# Patient Record
Sex: Female | Born: 2000 | Hispanic: No | Marital: Single | Smoking: Current every day smoker
Health system: Southern US, Community
[De-identification: ages and names within clinical notes are randomized; demographics above are authoritative.]

## PROBLEM LIST (undated history)

## (undated) DIAGNOSIS — K589 Irritable bowel syndrome without diarrhea: Secondary | ICD-10-CM

## (undated) DIAGNOSIS — R519 Headache, unspecified: Secondary | ICD-10-CM

## (undated) DIAGNOSIS — G8929 Other chronic pain: Secondary | ICD-10-CM

## (undated) HISTORY — DX: Headache, unspecified: R51.9

## (undated) HISTORY — DX: Other chronic pain: G89.29

---
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
## 2019-03-27 ENCOUNTER — Encounter: Payer: Self-pay | Admitting: Advanced Practice Midwife

## 2019-03-27 ENCOUNTER — Ambulatory Visit (LOCAL_COMMUNITY_HEALTH_CENTER): Payer: Medicaid Other | Admitting: Advanced Practice Midwife

## 2019-03-27 ENCOUNTER — Other Ambulatory Visit: Payer: Self-pay

## 2019-03-27 VITALS — BP 107/67 | Ht 59.0 in | Wt 97.0 lb

## 2019-03-27 DIAGNOSIS — Z3009 Encounter for other general counseling and advice on contraception: Secondary | ICD-10-CM

## 2019-03-27 DIAGNOSIS — Z30017 Encounter for initial prescription of implantable subdermal contraceptive: Secondary | ICD-10-CM

## 2019-03-27 MED ORDER — MULTIVITAMINS PO CAPS: 1.0000 | ORAL_CAPSULE | Freq: Every day | ORAL | 0 refills | Status: AC

## 2019-03-27 MED ORDER — ETONOGESTREL 68 MG ~~LOC~~ IMPL
68.0000 mg | DRUG_IMPLANT | Freq: Once | SUBCUTANEOUS | Status: AC
  Administered 2019-03-27: 16:00:00 68 mg via SUBCUTANEOUS

## 2019-03-27 NOTE — Progress Notes (Signed)
   [facility] problem visit  [facility]  Subjective:  Alicia Lawrence is a 19 y.o. SAF nullip nonsmoker being seen today for Nexplanon insertion  Chief Complaint  Patient presents with  . Procedure  . Contraception    HPI  LMP 03/08/19.  Last sex 03/24/19 with condom.  In 11 mo relationship with partner who is treating her much better than last "toxic" relationship whom she put a restraining order on.  Last physical 04/06/17.  Last DMPA 12/25/18. Does the patient have a current or past history of drug use? No   No components found for: HCV]   Health Maintenance Due  Topic Date Due  . HIV Screening  09/06/2014  . TETANUS/TDAP  09/06/2018  . INFLUENZA VACCINE  09/08/2018    ROS  The following portions of the patient's history were reviewed and updated as appropriate: allergies, current medications, past family history, past medical history, past social history, past surgical history and problem list. Problem list updated.   See flowsheet for other program required questions.  Objective:   Vitals:   03/27/19 1527  BP: 107/67  Weight: 97 lb (44 kg)  Height: 4\' 11"  (1.499 m)    Physical Exam n/a   Assessment and Plan:  Alicia Lawrence is a 19 y.o. female presenting to the [facility] Department for a Women's Health problem visit  1. Nexplanon insertion Nexplanon Insertion Procedure Patient identified, informed consent performed, consent signed.   Patient does understand that irregular bleeding is a very common side effect of this medication. She was advised to have backup contraception after placement. Patient was determined to meet WHO criteria for not being pregnant. Appropriate time out taken.  The insertion site was identified 8-10 cm (3-4 inches) from the medial epicondyle of the humerus and 3-5 cm (1.25-2 inches) posterior to (below) the sulcus (groove) between the biceps and triceps muscles of the patient's left arm and marked.   Patient was prepped with alcohol swab and then injected with 3 ml of 1% lidocaine.  Arm was prepped with chlorhexidene, Nexplanon removed from packaging,  Device confirmed in needle, then inserted full length of needle and withdrawn per handbook instructions. Nexplanon was able to palpated in the patient's arm; patient palpated the insert herself. There was minimal blood loss.  Patient insertion site covered with guaze and a pressure bandage to reduce any bruising.  The patient tolerated the procedure well and was given post procedure instructions.  Nexplanon:   Counseled patient to take OTC analgesic starting as soon as lidocaine starts to wear off and take regularly for at least 48 hr to decrease discomfort.  Specifically to take with food or milk to decrease stomach upset and for IB 600 mg (3 tablets) every 6 hrs; IB 800 mg (4 tablets) every 8 hrs; or Aleve 2 tablets every 12 hrs.    - etonogestrel (NEXPLANON) implant 68 mg  2. Family planning Still covered with DMPA so no need for backup birth control  3. Encounter for initial prescription of implantable subdermal contraceptive      No follow-ups on file.  No future appointments.  [facility], CNM

## 2019-03-27 NOTE — Progress Notes (Signed)
Here today for Nexplanon Insertion. Last PE was here 04/06/2017. Last Depo was 12/25/2018 (13.1 weeks.) Tawny Hopping, RN

## 2019-03-27 NOTE — Progress Notes (Signed)
MVI's and condoms given. Tawny Hopping, RN

## 2019-03-27 NOTE — Addendum Note (Signed)
Addended by: Tawny Hopping A on: 03/27/2019 04:31 PM   Modules accepted: Orders

## 2020-03-19 ENCOUNTER — Encounter (HOSPITAL_COMMUNITY): Payer: Self-pay

## 2020-03-19 ENCOUNTER — Emergency Department (HOSPITAL_COMMUNITY): Payer: Medicaid Other

## 2020-03-19 ENCOUNTER — Other Ambulatory Visit: Payer: Self-pay

## 2020-03-19 ENCOUNTER — Ambulatory Visit (HOSPITAL_COMMUNITY)
Admission: EM | Admit: 2020-03-19 | Discharge: 2020-03-19 | Disposition: A | Discharge status: Home / Self Care | Payer: Medicaid Other | Attending: Emergency Medicine | Admitting: Emergency Medicine

## 2020-03-19 ENCOUNTER — Emergency Department (HOSPITAL_COMMUNITY)
Admission: EM | Admit: 2020-03-19 | Discharge: 2020-03-19 | Disposition: A | Discharge status: Home / Self Care | Payer: Medicaid Other | Attending: Emergency Medicine | Admitting: Emergency Medicine

## 2020-03-19 DIAGNOSIS — Z1152 Encounter for screening for COVID-19: Secondary | ICD-10-CM

## 2020-03-19 DIAGNOSIS — N12 Tubulo-interstitial nephritis, not specified as acute or chronic: Secondary | ICD-10-CM

## 2020-03-19 DIAGNOSIS — R7989 Other specified abnormal findings of blood chemistry: Secondary | ICD-10-CM

## 2020-03-19 DIAGNOSIS — N10 Acute tubulo-interstitial nephritis: Secondary | ICD-10-CM | POA: Insufficient documentation

## 2020-03-19 DIAGNOSIS — R109 Unspecified abdominal pain: Secondary | ICD-10-CM

## 2020-03-19 DIAGNOSIS — R945 Abnormal results of liver function studies: Secondary | ICD-10-CM | POA: Insufficient documentation

## 2020-03-19 HISTORY — DX: Irritable bowel syndrome, unspecified: K58.9

## 2020-03-19 LAB — POCT URINALYSIS DIPSTICK, ED / UC
Bilirubin Urine: NEGATIVE
Glucose, UA: NEGATIVE mg/dL
Ketones, ur: NEGATIVE mg/dL
Nitrite: NEGATIVE
Protein, ur: 100 mg/dL — AB
Specific Gravity, Urine: 1.015 (ref 1.005–1.030)
Urobilinogen, UA: 1 mg/dL (ref 0.0–1.0)
pH: 7 (ref 5.0–8.0)

## 2020-03-19 LAB — CBC WITH DIFFERENTIAL/PLATELET
Abs Immature Granulocytes: 0.03 K/uL (ref 0.00–0.07)
Basophils Absolute: 0 K/uL (ref 0.0–0.1)
Basophils Relative: 0 %
Eosinophils Absolute: 0 K/uL (ref 0.0–0.5)
Eosinophils Relative: 0 %
HCT: 28 % — ABNORMAL LOW (ref 36.0–46.0)
Hemoglobin: 9.5 g/dL — ABNORMAL LOW (ref 12.0–15.0)
Immature Granulocytes: 0 %
Lymphocytes Relative: 5 %
Lymphs Abs: 0.5 K/uL — ABNORMAL LOW (ref 0.7–4.0)
MCH: 23.4 pg — ABNORMAL LOW (ref 26.0–34.0)
MCHC: 33.9 g/dL (ref 30.0–36.0)
MCV: 69 fL — ABNORMAL LOW (ref 80.0–100.0)
Monocytes Absolute: 0.8 K/uL (ref 0.1–1.0)
Monocytes Relative: 8 %
Neutro Abs: 8.9 K/uL — ABNORMAL HIGH (ref 1.7–7.7)
Neutrophils Relative %: 87 %
Platelets: 183 K/uL (ref 150–400)
RBC: 4.06 MIL/uL (ref 3.87–5.11)
RDW: 13.7 % (ref 11.5–15.5)
WBC: 10.2 K/uL (ref 4.0–10.5)
nRBC: 0 % (ref 0.0–0.2)

## 2020-03-19 LAB — COMPREHENSIVE METABOLIC PANEL
ALT: 96 U/L — ABNORMAL HIGH (ref 0–44)
AST: 127 U/L — ABNORMAL HIGH (ref 15–41)
Albumin: 4.2 g/dL (ref 3.5–5.0)
Alkaline Phosphatase: 55 U/L (ref 38–126)
Anion gap: 11 (ref 5–15)
BUN: 11 mg/dL (ref 6–20)
CO2: 22 mmol/L (ref 22–32)
Calcium: 8.9 mg/dL (ref 8.9–10.3)
Chloride: 102 mmol/L (ref 98–111)
Creatinine, Ser: 0.76 mg/dL (ref 0.44–1.00)
GFR, Estimated: >60 mL/min (ref >60)
Glucose, Bld: 100 mg/dL — ABNORMAL HIGH (ref 70–99)
Potassium: 3.6 mmol/L (ref 3.5–5.1)
Sodium: 135 mmol/L (ref 135–145)
Total Bilirubin: 1 mg/dL (ref 0.3–1.2)
Total Protein: 8.2 g/dL — ABNORMAL HIGH (ref 6.5–8.1)

## 2020-03-19 LAB — URINALYSIS, ROUTINE W REFLEX MICROSCOPIC
Bilirubin Urine: NEGATIVE
Glucose, UA: NEGATIVE mg/dL
Ketones, ur: 5 mg/dL — AB
Nitrite: POSITIVE — AB
Protein, ur: NEGATIVE mg/dL
Specific Gravity, Urine: 1.003 — ABNORMAL LOW (ref 1.005–1.030)
WBC, UA: >50 WBC/hpf — ABNORMAL HIGH (ref 0–5)
pH: 6 (ref 5.0–8.0)

## 2020-03-19 LAB — POC URINE PREG, ED: Preg Test, Ur: NEGATIVE

## 2020-03-19 LAB — LACTIC ACID, PLASMA: Lactic Acid, Venous: 1.1 mmol/L (ref 0.5–1.9)

## 2020-03-19 LAB — LIPASE, BLOOD: Lipase: 29 U/L (ref 11–51)

## 2020-03-19 MED ORDER — ONDANSETRON 4 MG PO TBDP
ORAL_TABLET | ORAL | Status: AC
  Filled 2020-03-19: qty 1

## 2020-03-19 MED ORDER — ONDANSETRON HCL 4 MG PO TABS: 4.0000 mg | ORAL_TABLET | Freq: Four times a day (QID) | ORAL | 0 refills | Status: DC

## 2020-03-19 MED ORDER — SODIUM CHLORIDE 0.9 % IV BOLUS
1000.0000 mL | Freq: Once | INTRAVENOUS | Status: AC
  Administered 2020-03-19: 1000 mL via INTRAVENOUS

## 2020-03-19 MED ORDER — IOHEXOL 300 MG/ML  SOLN
100.0000 mL | Freq: Once | INTRAMUSCULAR | Status: AC | PRN
  Administered 2020-03-19: 100 mL via INTRAVENOUS

## 2020-03-19 MED ORDER — ONDANSETRON HCL 4 MG/2ML IJ SOLN
4.0000 mg | Freq: Once | INTRAMUSCULAR | Status: AC
  Administered 2020-03-19: 4 mg via INTRAVENOUS
  Filled 2020-03-19: qty 2

## 2020-03-19 MED ORDER — KETOROLAC TROMETHAMINE 30 MG/ML IJ SOLN
30.0000 mg | Freq: Once | INTRAMUSCULAR | Status: AC
  Administered 2020-03-19: 30 mg via INTRAMUSCULAR

## 2020-03-19 MED ORDER — SODIUM CHLORIDE 0.9 % IV SOLN
1.0000 g | Freq: Once | INTRAVENOUS | Status: AC
  Administered 2020-03-19: 1 g via INTRAVENOUS
  Filled 2020-03-19: qty 10

## 2020-03-19 MED ORDER — ONDANSETRON 4 MG PO TBDP
4.0000 mg | ORAL_TABLET | Freq: Once | ORAL | Status: AC
  Administered 2020-03-19: 4 mg via ORAL

## 2020-03-19 MED ORDER — KETOROLAC TROMETHAMINE 30 MG/ML IJ SOLN
INTRAMUSCULAR | Status: AC
  Filled 2020-03-19: qty 1

## 2020-03-19 MED ORDER — CEPHALEXIN 500 MG PO CAPS: 500.0000 mg | ORAL_CAPSULE | Freq: Four times a day (QID) | ORAL | 0 refills | Status: AC

## 2020-03-19 NOTE — Discharge Instructions (Addendum)
  You were diagnosed with a kidney infection called pyelonephritis. For this, you were given a prescription for antibiotics. Please take the antibiotic prescription fully. You were also given a prescription for nausea medications.   Additionally, you were noted to have elevated liver enzymes. You will need to follow up with your primary physician to have your liver enzymes rechecked. You should abstain from alcohol.   Please follow up with your primary doctor within the next 5-7 days.  If you do not have a primary care provider, information for a healthcare clinic has been provided for you to make arrangements for follow up care. Please return to the ER sooner if you have any new or worsening symptoms, or if you have any of the following symptoms:  Abdominal pain that does not go away.  You have a fever.  You keep throwing up (vomiting).  The pain is felt only in portions of the abdomen. Pain in the right side could possibly be appendicitis. In an adult, pain in the left lower portion of the abdomen could be colitis or diverticulitis.  You pass bloody or black tarry stools.  There is bright red blood in the stool.  The constipation stays for more than 4 days.  There is belly (abdominal) or rectal pain.  You do not seem to be getting better.  You have any questions or concerns.

## 2020-03-19 NOTE — ED Provider Notes (Addendum)
HPI  SUBJECTIVE:  Alicia Lawrence is a 20 y.o. female who presents with 5 days of fevers T-max 102.8 measured here, constipation, headache, body aches, left back and flank pain described as constant tightness.  She states that this seems to be getting a little better today.  She states that she has felt nauseous and has been making herself throw up when she feels the need to belch, but started vomiting greenish nonbloody emesis spontaneously today.  She states that she has vomited "a lot" today, and Is unable to keep anything down.  No nasal congestion, rhinorrhea, sore throat, loss of smell or taste, cough, shortness of breath.  No dysuria, urgency, frequency, cloudy, odorous urine, hematuria, change in urine output.  No pelvic pain, vaginal bleeding, odor, discharge.  No abdominal distention.  No antipyretic in the past 6 hours.  She has tried Excedrin for the headache with improvement in her symptoms.  She has also tried Tylenol and ibuprofen.  Ibuprofen, vomiting and stooling helps with the abdominal pain.  She also states that hunching forward helps her abdominal pain.  Her abdominal pain is worse when she lies on the left side, and with walking. She had a small bowel movement today after taking a laxative and glycerin suppository.  She did not get the Covid vaccine.  No known Covid exposure.  Car ride over here was not painful.  She has a past medical history of IBS.  She does not recall having any history of nephrolithiasis, UTI, pyelonephritis.  No history of abdominal surgeries, colitis, immunosuppression, diabetes, hypertension.  LMP: She has a Nexplanon.  Denies the possibility of being pregnant.  PMD: None.    Past Medical History:  Diagnosis Date  . IBS (irritable bowel syndrome)     History reviewed. No pertinent surgical history.  Family History  Problem Relation Age of Onset  . Stroke Mother   . Healthy Father     Social History   Tobacco Use  . Smoking status: Never Smoker  .  Smokeless tobacco: Never Used  Substance Use Topics  . Alcohol use: Yes    Comment: occ  . Drug use: Yes    Types: Marijuana    Comment: occ    No current facility-administered medications for this encounter. No current outpatient medications on file.  No Known Allergies   ROS  As noted in HPI.   Physical Exam  BP 109/60 (BP Location: Right Arm)   Pulse (S) (!) 116   Temp (S) (!) 102.8 F (39.3 C) (Oral)   Resp 16   SpO2 100%   Constitutional: Well developed, well nourished, vomiting Eyes:  EOMI, conjunctiva normal bilaterally HENT: Normocephalic, atraumatic,mucus membranes moist Respiratory: Normal inspiratory effort Cardiovascular: Regular tachycardia no murmurs rubs or gallop GI: Normal appearance, flat, soft, nondistended.  Positive left flank tenderness.  No suprapubic tenderness.  Negative Murphy negative McBurney.  No other abdominal tenderness.  Active bowel sounds.  No guarding, rebound. Back: Left CVAT skin: No rash, skin intact Musculoskeletal: no deformities Neurologic: Alert & oriented x 3, no focal neuro deficits Psychiatric: Speech and behavior appropriate   ED Course   Medications  ondansetron (ZOFRAN-ODT) disintegrating tablet 4 mg (4 mg Oral Given 03/19/20 1427)  ketorolac (TORADOL) 30 MG/ML injection 30 mg (30 mg Intramuscular Given 03/19/20 1449)    Orders Placed This Encounter  Procedures  . POC urine preg, ED (not at [facility])    Standing Status:   Standing    Number of Occurrences:  1  . POCT Urinalysis Dipstick (ED/UC)    Standing Status:   Standing    Number of Occurrences:   1    Results for orders placed or performed during the hospital encounter of 03/19/20 (from the past 24 hour(s))  POCT Urinalysis Dipstick (ED/UC)     Status: Abnormal   Collection Time: 03/19/20  2:28 PM  Result Value Ref Range   Glucose, UA NEGATIVE NEGATIVE mg/dL   Bilirubin Urine NEGATIVE NEGATIVE   Ketones, ur NEGATIVE NEGATIVE mg/dL   Specific Gravity,  Urine 1.015 1.005 - 1.030   Hgb urine dipstick MODERATE (A) NEGATIVE   pH 7.0 5.0 - 8.0   Protein, ur 100 (A) NEGATIVE mg/dL   Urobilinogen, UA 1.0 0.0 - 1.0 mg/dL   Nitrite NEGATIVE NEGATIVE   Leukocytes,Ua LARGE (A) NEGATIVE  POC urine preg, ED (not at [facility])     Status: None   Collection Time: 03/19/20  2:30 PM  Result Value Ref Range   Preg Test, Ur NEGATIVE NEGATIVE   No results found.  ED Clinical Impression  1. Pyelonephritis   2. Abdominal pain, unspecified abdominal location      ED Assessment/Plan  Patient was actively vomiting, so was given 4 mg of Zofran.  Patient continued to vomit.  She has large leukocytes, moderate hematuria.  She is not pregnant.  Will give shot of Toradol for fever and abdominal pain.  Concern for pyelonephritis versus infected obstructing nephrolithiasis.  In the differential is colitis.  She meets two of the SIRS criteria, and is unable to tolerate p.o, could have early sepsis.  Cannot rule out obstructing nephrolithiasis here at the urgent care.  Transferring to the emergency department for comprehensive work-up, IV fluids.  Feel that she is stable to go by private vehicle.  Emphasized importance of going to the emergency department.  She agrees to go.  Discontinued Covid swab here.  Discussed labs, MDM, rationale for transfer to the emergency department with the patient.  She agrees with plan.  Meds ordered this encounter  Medications  . ondansetron (ZOFRAN-ODT) disintegrating tablet 4 mg  . ketorolac (TORADOL) 30 MG/ML injection 30 mg    *This clinic note was created using Scientist, clinical (histocompatibility and immunogenetics). Therefore, there may be occasional mistakes despite careful proofreading.   ?    Domenick Gong, MD 03/19/20 1504    Domenick Gong, MD 03/19/20 [phone]

## 2020-03-19 NOTE — ED Triage Notes (Signed)
Patient c/o intermittent left flank pain and emesis x 5 days. Patient denies any dysuria or frequency. Patient also reports no BM in 5 days

## 2020-03-19 NOTE — ED Notes (Signed)
Patient is being discharged from the Urgent Care and sent to the Emergency Department via POV . Per Dr Chaney Malling, patient is in need of higher level of care due to concern for pyelonephritis/kidney stone, requiring further work up. Patient is aware and verbalizes understanding of plan of care.  Vitals:   03/19/20 1400  BP: 109/60  Pulse: (S) (!) 116  Resp: 16  Temp: (S) (!) 102.8 F (39.3 C)  SpO2: 100%

## 2020-03-19 NOTE — ED Provider Notes (Addendum)
[facility]-EMERGENCY DEPT Provider Note   CSN: [id number] Arrival date & time: 03/19/20  1528     History Chief Complaint  Patient presents with  . Flank Pain  . Emesis  . Constipation    Alicia Lawrence is a 20 y.o. female.  HPI   Pt is a 20 y/o female with a h/o IBS who presents to the ED today for eval of left flank pain and left sided abd pain that has been present for the last 5 days. States pain has been constant but is intermittently relieved with ibuprofen and Excedrin. States she has had similar sxs in the past that she attributes to IBS. Currently rates pain 3/10 after receiving toradol at urgent care. She further reports nausea, vomiting. Denies dysuria, frequency, or hematuria. Denies abnormal vaginal discharge, vaginal bleeding or concern for STD. She has had some associated constipation and took some OTC laxatives. She was able to have a BM earlier today.   She was at urgent care PTA and sent here for further eval as she was febrile and they had concern for a possible infected stone  Past Medical History:  Diagnosis Date  . IBS (irritable bowel syndrome)     There are no problems to display for this patient.   History reviewed. No pertinent surgical history.   OB History   No obstetric history on file.     Family History  Problem Relation Age of Onset  . Stroke Mother   . Healthy Father     Social History   Tobacco Use  . Smoking status: Never Smoker  . Smokeless tobacco: Never Used  Vaping Use  . Vaping Use: Some days  . Substances: Nicotine  Substance Use Topics  . Alcohol use: Yes    Comment: occ  . Drug use: Yes    Types: Marijuana    Comment: occ    Home Medications Prior to Admission medications   Medication Sig Start Date End Date Taking? Authorizing Provider  cephALEXin (KEFLEX) 500 MG capsule Take 1 capsule (500 mg total) by mouth 4 (four) times daily for 14 days. 03/19/20 04/02/20 Yes Kinslee Dalpe S, PA-C   ondansetron (ZOFRAN) 4 MG tablet Take 1 tablet (4 mg total) by mouth every 6 (six) hours. 03/19/20  Yes Enrigue Hashimi S, PA-C    Allergies    Patient has no known allergies.  Review of Systems   Review of Systems  Constitutional: Positive for fever.  HENT: Negative for ear pain and sore throat.   Eyes: Negative for pain and visual disturbance.  Respiratory: Negative for cough and shortness of breath.   Cardiovascular: Negative for chest pain.  Gastrointestinal: Positive for abdominal pain, constipation, nausea and vomiting. Negative for diarrhea.  Genitourinary: Negative for dysuria and hematuria.  Musculoskeletal: Negative for back pain.  Skin: Negative for rash.  Neurological: Negative for seizures and syncope.  All other systems reviewed and are negative.   Physical Exam Updated Vital Signs BP 101/69   Pulse 89   Temp 98.7 F (37.1 C) (Oral)   Resp (!) 25   Ht 4\' 11"  (1.499 m)   Wt 39.9 kg   SpO2 100%   BMI 17.77 kg/m   Physical Exam Vitals and nursing note reviewed.  Constitutional:      General: She is not in acute distress.    Appearance: She is well-developed and well-nourished.  HENT:     Head: Normocephalic and atraumatic.  Eyes:     Conjunctiva/sclera: Conjunctivae  normal.  Cardiovascular:     Rate and Rhythm: Normal rate and regular rhythm.     Pulses: Normal pulses.     Heart sounds: Normal heart sounds. No murmur heard.   Pulmonary:     Effort: Pulmonary effort is normal. No respiratory distress.     Breath sounds: Normal breath sounds. No wheezing, rhonchi or rales.  Abdominal:     General: Bowel sounds are normal.     Palpations: Abdomen is soft.     Tenderness: Tenderness: mild llq ttp. There is left CVA tenderness. There is no right CVA tenderness, guarding or rebound.  Musculoskeletal:        General: No edema.     Cervical back: Neck supple.  Skin:    General: Skin is warm and dry.  Neurological:     Mental Status: She is alert.   Psychiatric:        Mood and Affect: Mood and affect normal.     ED Results / Procedures / Treatments   Labs (all labs ordered are listed, but only abnormal results are displayed) Labs Reviewed  URINALYSIS, ROUTINE W REFLEX MICROSCOPIC - Abnormal; Notable for the following components:      Result Value   Specific Gravity, Urine 1.003 (*)    Hgb urine dipstick SMALL (*)    Ketones, ur 5 (*)    Nitrite POSITIVE (*)    Leukocytes,Ua LARGE (*)    WBC, UA >50 (*)    Bacteria, UA MANY (*)    Non Squamous Epithelial 0-5 (*)    All other components within normal limits  CBC WITH DIFFERENTIAL/PLATELET - Abnormal; Notable for the following components:   Hemoglobin 9.5 (*)    HCT 28.0 (*)    MCV 69.0 (*)    MCH 23.4 (*)    Neutro Abs 8.9 (*)    Lymphs Abs 0.5 (*)    All other components within normal limits  COMPREHENSIVE METABOLIC PANEL - Abnormal; Notable for the following components:   Glucose, Bld 100 (*)    Total Protein 8.2 (*)    AST 127 (*)    ALT 96 (*)    All other components within normal limits  URINE CULTURE  LIPASE, BLOOD  LACTIC ACID, PLASMA    EKG None  Radiology CT ABDOMEN PELVIS W CONTRAST  Result Date: 03/19/2020 CLINICAL DATA:  Abdominal pain, fever, vomiting.  Left flank pain. EXAM: CT ABDOMEN AND PELVIS WITH CONTRAST TECHNIQUE: Multidetector CT imaging of the abdomen and pelvis was performed using the standard protocol following bolus administration of intravenous contrast. CONTRAST:  OMNIPAQUE IOHEXOL 300 MG/ML  SOLN COMPARISON:  12/03/2015 FINDINGS: Lower chest: Lung bases are clear. No effusions. Heart is normal size. Hepatobiliary: No focal hepatic abnormality. Gallbladder unremarkable. Pancreas: No focal abnormality or ductal dilatation. Spleen: No focal abnormality.  Normal size. Adrenals/Urinary Tract: Areas of decreased enhancement throughout the left kidney with striated nephrogram compatible with pyelonephritis. No stones or hydronephrosis. No  renal or adrenal mass. Urinary bladder unremarkable. Stomach/Bowel: Normal appendix. Stomach, large and small bowel grossly unremarkable. Vascular/Lymphatic: No evidence of aneurysm or adenopathy. Reproductive: Uterus and adnexa unremarkable.  No mass. Other: Small to moderate free fluid in the pelvis.  No free air. Musculoskeletal: No acute bony abnormality. IMPRESSION: Areas of decreased enhancement in the left kidney with striated nephrogram compatible with pyelonephritis. Electronically Signed   By: Charlett Nose M.D.   On: 03/19/2020 19:08    Procedures Procedures   Medications Ordered in ED  Medications  sodium chloride 0.9 % bolus 1,000 mL (0 mLs Intravenous Stopped 03/19/20 1700)  ondansetron (ZOFRAN) injection 4 mg (4 mg Intravenous Given 03/19/20 1632)  cefTRIAXone (ROCEPHIN) 1 g in sodium chloride 0.9 % 100 mL IVPB (1 g Intravenous New Bag/Given 03/19/20 1830)  iohexol (OMNIPAQUE) 300 MG/ML solution 100 mL (100 mLs Intravenous Contrast Given 03/19/20 1850)    ED Course  I have reviewed the triage vital signs and the nursing notes.  Pertinent labs & imaging results that were available during my care of the patient were reviewed by me and considered in my medical decision making (see chart for details).    MDM Rules/Calculators/A&P                          20 y/o F presenting for eval of left flank pain for 5 days. Today with nv and fever at urgent care  Reviewed/interpreted labs CBC with anemia, hgb 9.5 which appears grossly stable from prior CMP with normal electrolytes, normal BUN/Cr, elevated LFTs  - pt admits to drinking heavily a few days ago, advised to f/u with pcp for recheck of these labs Lipase wnl Lactic neg UA with nitrites, leukocyties, 0-5 rbc, >50 wbcs, many pacertai, wbc clumps consistent with UTI. Urine culture sent. Urine preg at urgent care neg  Reviewed/interpreted imaging  CT abd/pelvis - Areas of decreased enhancement in the left kidney with striated  nephrogram compatible with pyelonephritis.  Pt with pyelonephritis, she is nontoxic appearing and nonseptic appearing. Lactic acid is negative. No white count. She was given a dose of ceftriaxone in the ED. She feels improved after meds and is able to tolerate po. I feel she is appropriate for dc with abx and close pcp f/u. Advised on specific return precautions. She voices understanding of the plan and reasons to return. All questions answered, pt stable for discharge.   Final Clinical Impression(s) / ED Diagnoses Final diagnoses:  Pyelonephritis  Elevated LFTs    Rx / DC Orders ED Discharge Orders         Ordered    cephALEXin (KEFLEX) 500 MG capsule  4 times daily        03/19/20 1927    ondansetron (ZOFRAN) 4 MG tablet  Every 6 hours        03/19/20 1927           Karrie Meres, PA-C 03/19/20 1931    Karrie Meres, PA-C 03/19/20 1931    Tilden Fossa, MD 03/19/20 2023

## 2020-03-19 NOTE — Discharge Instructions (Addendum)
We have given you a shot of Toradol and 4 mg of Zofran here.  I am concerned that you could have an infected kidney stone.  I think that you have a kidney infection, at the very least.  I am sending you to the ER for fluids, labs, and comprehensive evaluation.  Let them know if your pain changes or gets worse.

## 2020-03-19 NOTE — ED Triage Notes (Signed)
Patient presents to Urgent Care with complaints of nausea, headache, vomiting, fever, constipation, back pain and lower abdominal pain since Saturday. Treating symptoms with Pepto, ibuprofen, laxatives and Excedrin. Pt states she has a hx of IBS and kidney stones unsure if symptoms related to medical hx.

## 2020-03-22 LAB — URINE CULTURE: Culture: >=100000 — AB

## 2020-03-23 ENCOUNTER — Telehealth: Payer: Self-pay | Admitting: Emergency Medicine

## 2020-03-23 NOTE — Telephone Encounter (Signed)
Post ED Visit - Positive Culture Follow-up  Culture report reviewed by antimicrobial stewardship pharmacist: [facility] []  , Pharm.D. []  Enzo Bi, Pharm.D., BCPS AQ-ID []  , Pharm.D., BCPS []  Celedonio Miyamoto, .D., BCPS []  [place], .D., BCPS, AAHIVP []  Georgina Pillion, Pharm.D., BCPS, AAHIVP []  [address], PharmD, BCPS []  , PharmD, BCPS []  [place], PharmD, BCPS []  [place], PharmD []  , PharmD, BCPS []  Estella Husk, PharmD  Pharmacy Team []  Lysle Pearl, PharmD []  , PharmD []  Phillips Climes, PharmD []  , Rph []  Agapito Games) , PharmD []  Verlan Friends, PharmD []  , PharmD []  Mervyn Gay, PharmD []  , PharmD []  Vinnie Level, PharmD []  [facility], PharmD []  , PharmD []  Len Childs, PharmD   Positive urine culture Treated with cephalexin, organism sensitive to the same and no further patient follow-up is required at this time.  03/23/2020, 11:46 AM

## 2021-06-12 IMAGING — CT CT ABD-PELV W/ CM
2 of 4 series · 16 of 46 positions shown, 18 images · IV contrast (OMNIPAQUE 300)
Comparison: 12/03/2015

CLINICAL DATA: Abdominal pain, fever, vomiting.  Left flank pain.

EXAM:
CT ABDOMEN AND PELVIS WITH CONTRAST
TECHNIQUE: Multidetector CT imaging of the abdomen and pelvis was performed
using the standard protocol following bolus administration of
intravenous contrast.
CONTRAST:  100mL OMNIPAQUE IOHEXOL 300 MG/ML  SOLN

[Series 2: axial st · axial · 0.66mm/px · z∈[+1162,+1517]mm · 13 of 81 slices shown, 15 images]
[im 5/81  soft-tissue]
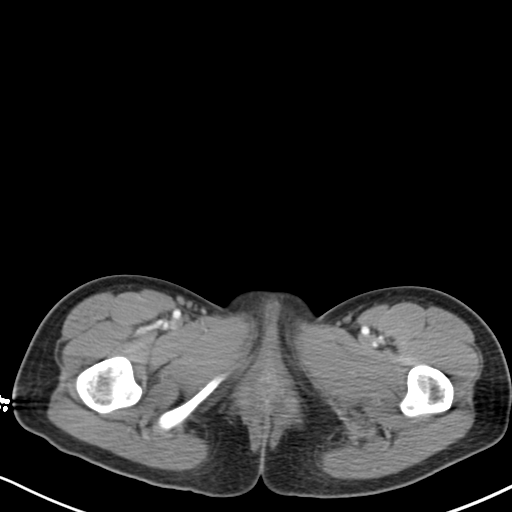
[im 5/81  bone]
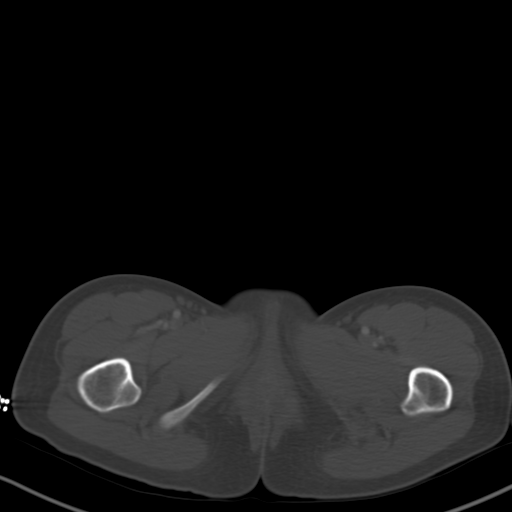
[im 10/81  soft-tissue]
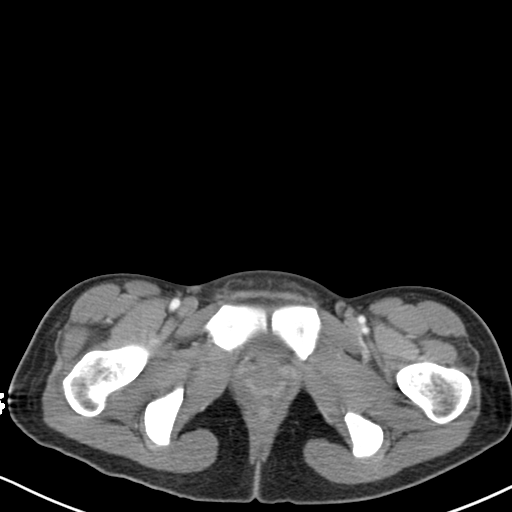
[im 19/81  soft-tissue]
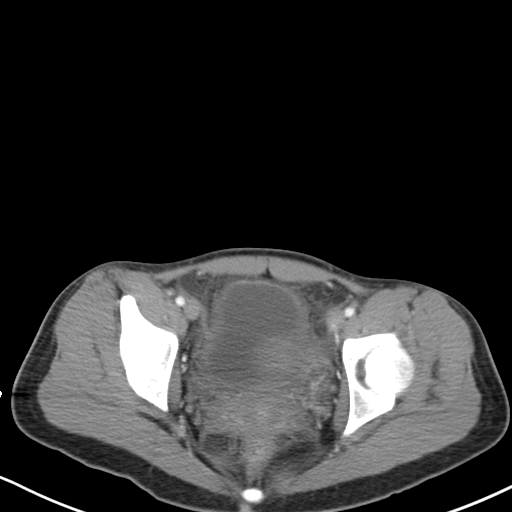
[im 24/81  soft-tissue]
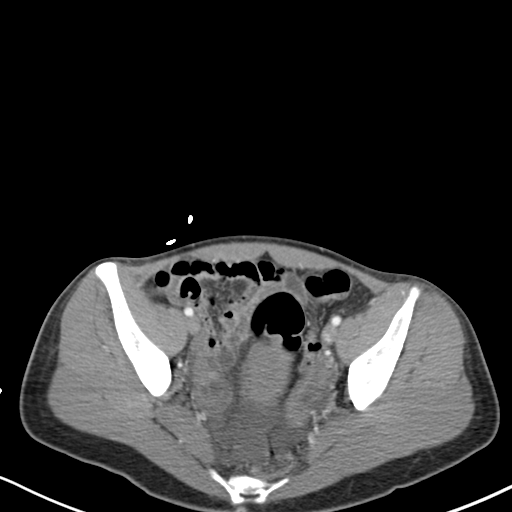
[im 29/81  soft-tissue]
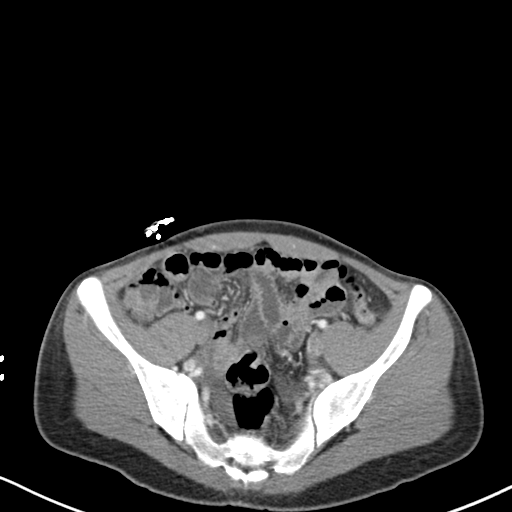
[im 33/81  soft-tissue]
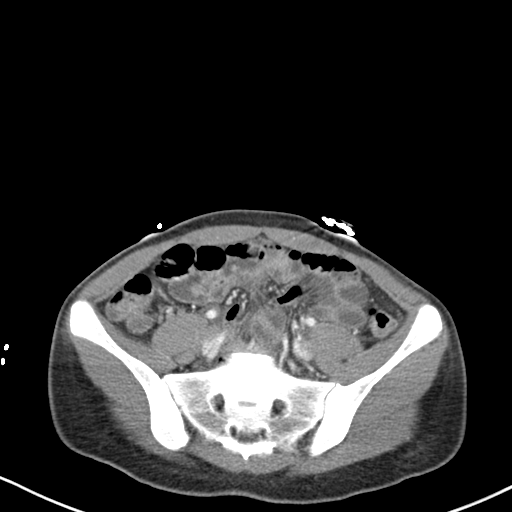
[im 43/81  soft-tissue]
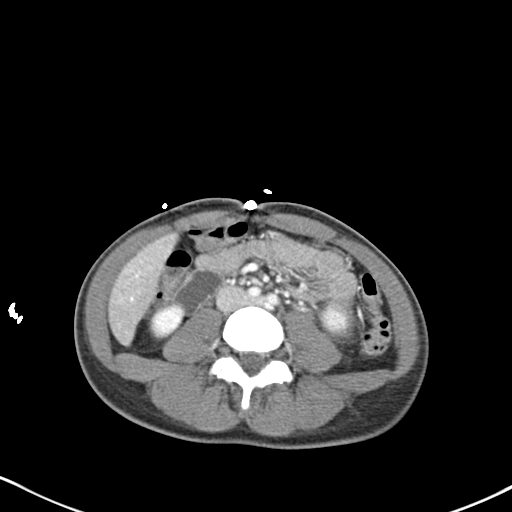
[im 48/81  soft-tissue]
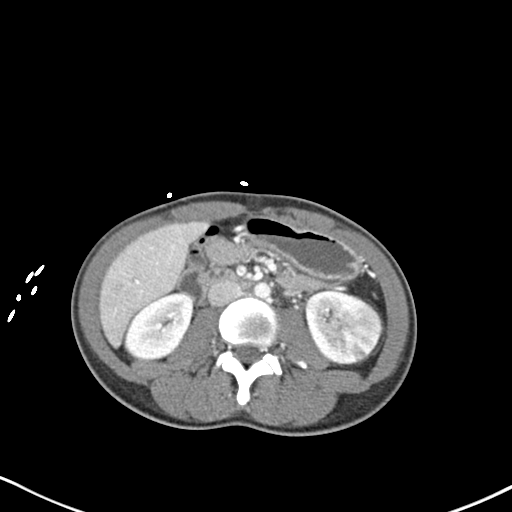
[im 52/81  soft-tissue]
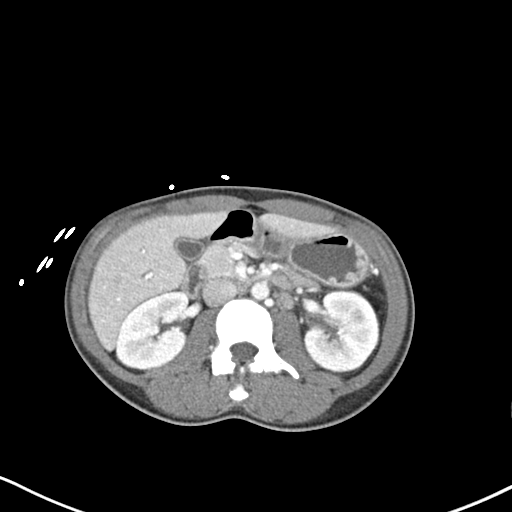
[im 52/81  bone]
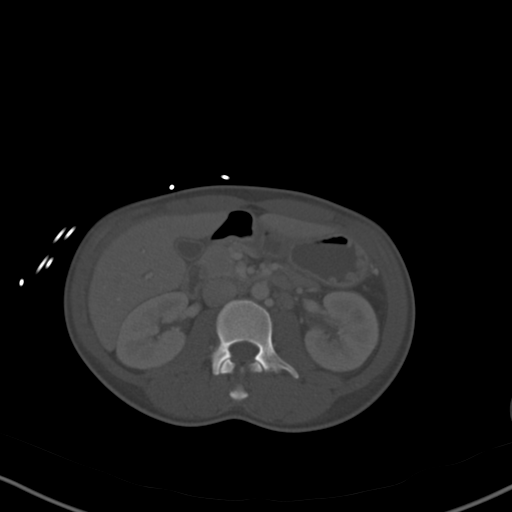
[im 57/81  soft-tissue]
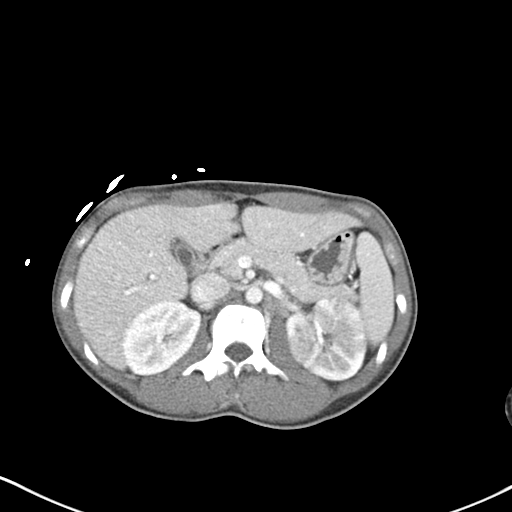
[im 62/81  soft-tissue]
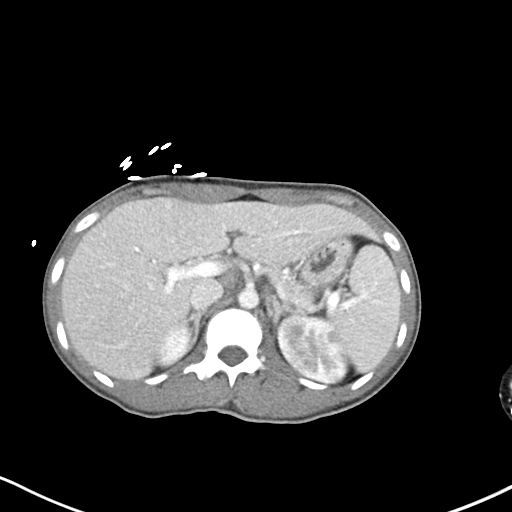
[im 71/81  soft-tissue]
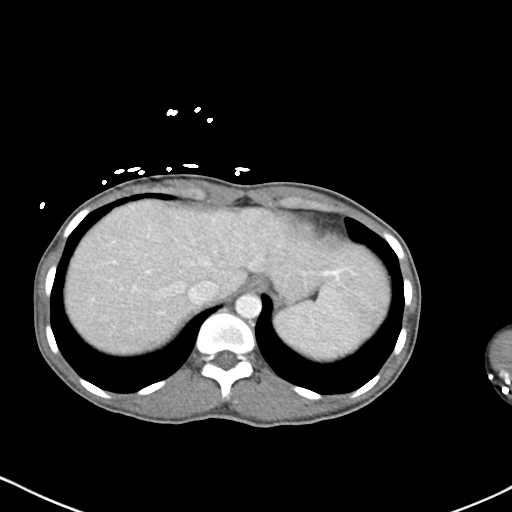
[im 76/81  soft-tissue]
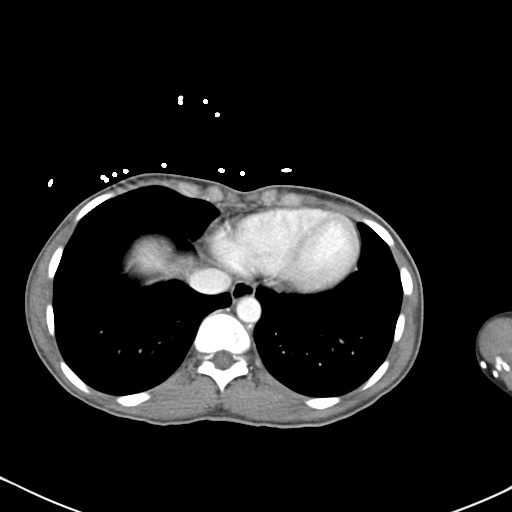

[Series 4: coronal st · coronal · 0.60mm/px · 3 of 101 slices shown]
[im 34/101  soft-tissue]
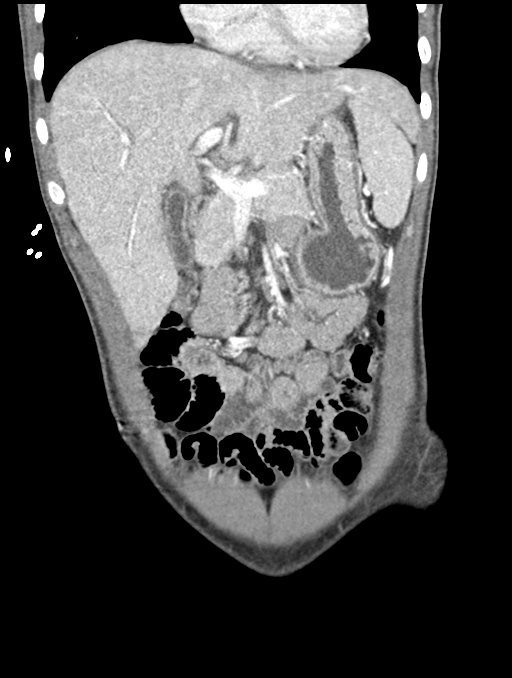
[im 45/101  soft-tissue]
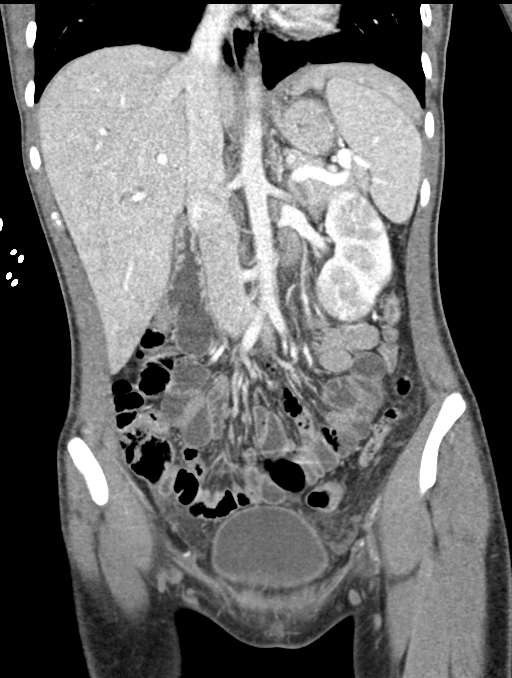
[im 56/101  soft-tissue]
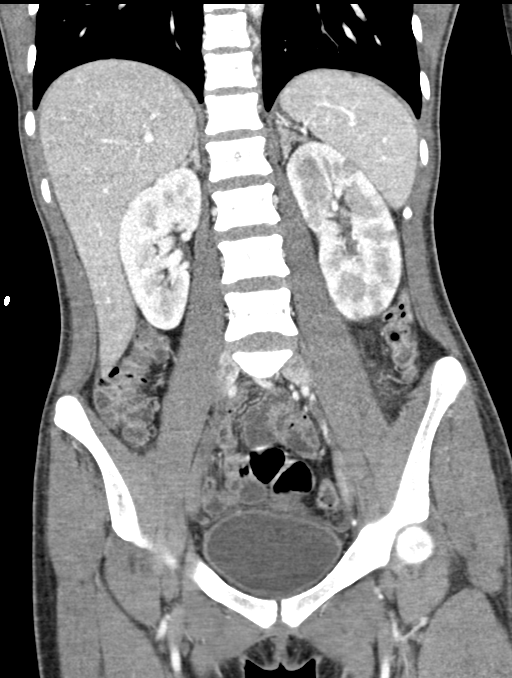

[16 of 46 positions shown; findings below may reference images not displayed]

FINDINGS: Lower chest: Lung bases are clear. No effusions. Heart is normal
size.

Hepatobiliary: No focal hepatic abnormality. Gallbladder
unremarkable.

Pancreas: No focal abnormality or ductal dilatation.

Spleen: No focal abnormality.  Normal size.

Adrenals/Urinary Tract: Areas of decreased enhancement throughout
the left kidney with striated nephrogram compatible with
pyelonephritis. No stones or hydronephrosis. No renal or adrenal
mass. Urinary bladder unremarkable.

Stomach/Bowel: Normal appendix. Stomach, large and small bowel
grossly unremarkable.

Vascular/Lymphatic: No evidence of aneurysm or adenopathy.

Reproductive: Uterus and adnexa unremarkable.  No mass.

Other: Small to moderate free fluid in the pelvis.  No free air.

Musculoskeletal: No acute bony abnormality.
IMPRESSION: Areas of decreased enhancement in the left kidney with striated
nephrogram compatible with pyelonephritis.

## 2021-09-24 ENCOUNTER — Ambulatory Visit (LOCAL_COMMUNITY_HEALTH_CENTER): Payer: Medicaid Other | Admitting: Advanced Practice Midwife

## 2021-09-24 ENCOUNTER — Encounter: Payer: Self-pay | Admitting: Advanced Practice Midwife

## 2021-09-24 VITALS — BP 106/74 | HR 93 | Temp 98.6°F | Ht 59.0 in | Wt 91.6 lb

## 2021-09-24 DIAGNOSIS — Z30017 Encounter for initial prescription of implantable subdermal contraceptive: Secondary | ICD-10-CM | POA: Diagnosis not present

## 2021-09-24 DIAGNOSIS — Z3009 Encounter for other general counseling and advice on contraception: Secondary | ICD-10-CM

## 2021-09-24 DIAGNOSIS — Z309 Encounter for contraceptive management, unspecified: Secondary | ICD-10-CM

## 2021-09-24 DIAGNOSIS — Z01419 Encounter for gynecological examination (general) (routine) without abnormal findings: Secondary | ICD-10-CM | POA: Diagnosis not present

## 2021-09-24 DIAGNOSIS — Z720 Tobacco use: Secondary | ICD-10-CM

## 2021-09-24 DIAGNOSIS — K589 Irritable bowel syndrome without diarrhea: Secondary | ICD-10-CM

## 2021-09-24 DIAGNOSIS — Z3046 Encounter for surveillance of implantable subdermal contraceptive: Secondary | ICD-10-CM

## 2021-09-24 DIAGNOSIS — Z789 Other specified health status: Secondary | ICD-10-CM

## 2021-09-24 NOTE — Progress Notes (Signed)
[facility] [address] Main Number: [phone]    Family Planning Visit- Initial Visit  Subjective:  Alicia Lawrence is a 21 y.o. SAF vaper nullip   being seen today for an initial annual visit and to discuss reproductive life planning.  The patient is currently using Hormonal Implant for pregnancy prevention. Patient reports   does want a pregnancy in the next year.     report they are looking for a method that provides Other wants to conceive  Patient has the following medical conditions has IBS (irritable bowel syndrome) and Vapes nicotine containing substance on their problem list.  Chief Complaint  Patient presents with   Contraception    Here for Annual Physical and Nexplanon Removal    Patient reports here for physical and Nexplanon removal because wants to conceive. First Nexplanon removed 04/07/17. This Nexplanon inserted 03/27/19. LMP 09/22/21. Last sex 09/22/21 without condom; with current partner x 3 years; 1 partner in last 3 mo. 20 lifetime partners and onset coitus age 72. Last dental exam 08/2018. Current vaper. Last MJ 3 days ago. Last ETOH 09/18/21 (1 glass Hypnotic) 2-3x/mo. Working 60 hrs/wk. Not in school. Living with boyfriend and his family. Last PE 04/25/2018. Never had pap. +cry 2x/mo, increased sleep, appetite wnl, +moody, irritable, energy level up and down, -anhedonia,-SI/HI. PHQ-9=7. Declines counseling  Patient denies cigs, cigars  There is no height or weight on file to calculate BMI. - Patient is eligible for diabetes screening based on BMI and age >58?  not applicable HA1C ordered? not applicable  Patient reports 1  partner/s in last year. Desires STI screening?  No - declines bloodwork  Has patient been screened once for HCV in the past?  No  No results found for: "HCVAB"  Does the patient have current drug use (including MJ), have a partner with drug use, and/or has been incarcerated since last  result? No  If yes-- Screen for HCV through [facility]   Does the patient meet criteria for HBV testing? No  Criteria:  -Household, sexual or needle sharing contact with HBV -History of drug use -HIV positive -Those with known Hep C   Health Maintenance Due  Topic Date Due   COVID-19 Vaccine (1) Never done   HPV VACCINES (1 - 2-dose series) Never done   HIV Screening  Never done   Hepatitis C Screening  Never done   TETANUS/TDAP  Never done   PAP-Cervical Cytology Screening  Never done   PAP SMEAR-Modifier  Never done   INFLUENZA VACCINE  09/07/2021    Review of Systems  Constitutional:  Positive for weight loss (25 lb wt loss over last 2 years; eats 2x/day).  Neurological:  Positive for headaches (5x/mo in different places on her head relieved with IB; -N&V, -audio, -vision; she thinks due to hair extensions).    The following portions of the patient's history were reviewed and updated as appropriate: allergies, current medications, past family history, past medical history, past social history, past surgical history and problem list. Problem list updated.   See flowsheet for other program required questions.  Objective:  There were no vitals filed for this visit.  Physical Exam Constitutional:      Appearance: Normal appearance. She is normal weight.  HENT:     Head: Normocephalic and atraumatic.     Mouth/Throat:     Mouth: Mucous membranes are moist.     Comments: Good; last dental exam 08/2018 Eyes:  Conjunctiva/sclera: Conjunctivae normal.  Cardiovascular:     Rate and Rhythm: Normal rate and regular rhythm.  Pulmonary:     Effort: Pulmonary effort is normal.     Breath sounds: Normal breath sounds.  Chest:  Breasts:    Right: Normal.     Left: Normal.  Abdominal:     General: Abdomen is flat.     Palpations: Abdomen is soft.     Comments: Soft, good tone, without masses or tnedenrness  Genitourinary:    General: Normal vulva.     Exam position:  Lithotomy position.     Vagina: Bleeding (red menses blood) present.     Cervix: Normal.     Uterus: Normal.      Adnexa: Right adnexa normal and left adnexa normal.     Rectum: Normal.     Comments: Pap done Musculoskeletal:        General: Normal range of motion.     Cervical back: Normal range of motion and neck supple.  Skin:    General: Skin is warm and dry.  Neurological:     Mental Status: She is alert.  Psychiatric:        Mood and Affect: Mood normal.      Assessment and Plan:  Alicia Lawrence is a 21 y.o. female presenting to the [facility] Department for an initial annual wellness/contraceptive visit  Contraception counseling: Reviewed options based on patient desire and reproductive life plan. Patient is interested in No Method - No Contraceptive Precautions. This was provided to the patient today.  if not why not clearly documented  Risks, benefits, and typical effectiveness rates were reviewed.  Questions were answered.  Written information was also given to the patient to review.    The patient will follow up in  1 years for surveillance.  The patient was told to call with any further questions, or with any concerns about this method of contraception.  Emphasized use of condoms 100% of the time for STI prevention.  Need for ECP was assessed. Patient reported is on Nexplanon.  Reviewed options and patient desired No method of ECP, declined all    1. Irritable bowel syndrome, unspecified type Referred to primary care MD  2. Family planning Treat wet mount per standing orders Immunization nurse consult Needs Gardasil vax Please give pt primary care MD list  - WET PREP FOR TRICH, YEAST, CLUE - Hemoglobin, venipuncture - Chlamydia/Gonorrhea  Lab - Pap IG (Image Guided)  3. Nexplanon removal Nexplanon Removal from left arm Patient identified, informed consent performed, consent signed.   Appropriate time out taken. Nexplanon site identified.  Area  prepped in usual sterile fashon. 3 ml of 1% lidocaine with Epinephrine was used to anesthetize the area at the distal end of the implant and along implant site. A small stab incision was made right beside the implant on the distal portion.  The Nexplanon rod was grasped using straight hemostats/manual and removed without difficulty.  There was minimal blood loss. There were no complications.  Steri-strips were applied over the small incision.  A pressure bandage was applied to reduce any bruising.  The patient tolerated the procedure well and was given post procedure instructions.    Nexplanon:   Counseled patient to take OTC analgesic starting as soon as lidocaine starts to wear off and take regularly for at least 48 hr to decrease discomfort.  Specifically to take with food or milk to decrease stomach upset and for IB 600 mg (  3 tablets) every 6 hrs; IB 800 mg (4 tablets) every 8 hrs; or Aleve 2 tablets every 12 hrs.      4. Well woman exam with routine gynecological exam   5. Vapes nicotine containing substance Counseled via 5 A's to stop     Return in about 1 year (around 09/25/2022) for yearly physical exam.  No future appointments.  Herbie Saxon, CNM

## 2021-09-24 NOTE — Progress Notes (Addendum)
Family Planning Packet given that included the following contents: Healthy Habits HPV and Cervical Cancer pamphlet, condoms, Are you ready for a baby? booklet (patient expressed to provider desire to become pregnant), [facility] Social Psychologist, counselling, Emergency Services Card, Psychologist, occupational and Nexplanon post removal instructions. Reviewed In [facility] during visit. Shiela Mayer RN  See other encounter dated 09/22/21

## 2021-09-24 NOTE — Progress Notes (Signed)
Declines Gardasil immunization.

## 2021-09-27 LAB — WET PREP FOR TRICH, YEAST, CLUE
Trichomonas Exam: NEGATIVE
Yeast Exam: NEGATIVE

## 2021-09-27 LAB — HEMOGLOBIN, FINGERSTICK: Hemoglobin: 11.3 g/dL (ref 11.1–15.9)

## 2021-09-30 LAB — PAP IG (IMAGE GUIDED): PAP Smear Comment: 0

## 2022-03-10 ENCOUNTER — Ambulatory Visit
Admission: EM | Admit: 2022-03-10 | Discharge: 2022-03-10 | Disposition: A | Discharge status: Home / Self Care | Payer: Medicaid Other | Attending: Emergency Medicine | Admitting: Emergency Medicine

## 2022-03-10 DIAGNOSIS — H9203 Otalgia, bilateral: Secondary | ICD-10-CM | POA: Diagnosis not present

## 2022-03-10 DIAGNOSIS — H6993 Unspecified Eustachian tube disorder, bilateral: Secondary | ICD-10-CM | POA: Diagnosis not present

## 2022-03-10 DIAGNOSIS — R0981 Nasal congestion: Secondary | ICD-10-CM

## 2022-03-10 MED ORDER — GUAIFENESIN ER 600 MG PO TB12: 1200.0000 mg | ORAL_TABLET | Freq: Two times a day (BID) | ORAL | 0 refills | Status: DC | PRN

## 2022-03-10 NOTE — ED Triage Notes (Signed)
Patient to Urgent Care with complaints of bilateral ear fullness (reports they continuously feel clogged), nasal congestion/ drainage and sneezing. Reports seeing some blood when she blows her nose.   Symptoms started two days ago.

## 2022-03-10 NOTE — Discharge Instructions (Addendum)
Take the guaifenesin as directed.  Use Flonase nasal spray as directed.  Follow up with your primary care provider if your symptoms are not improving.    

## 2022-03-10 NOTE — ED Provider Notes (Signed)
Roderic Palau    CSN: [id number] Arrival date & time: 03/10/22  1418      History   Chief Complaint Chief Complaint  Patient presents with   Ear Fullness    HPI Alicia Lawrence is a 22 y.o. female.  Patient presents with 2 day history of ear fullness, congestion, sneezing, runny nose.  No fever, sore throat, cough, shortness of breath, vomiting, diarrhea, or other symptoms.  No OTC medications taken today.  Her medical history includes chronic headaches, IBS, pyelonephritis.   The history is provided by the patient and medical records.    Past Medical History:  Diagnosis Date   Chronic headaches    IBS (irritable bowel syndrome)     Patient Active Problem List   Diagnosis Date Noted   IBS (irritable bowel syndrome) 09/24/2021   Vapes nicotine containing substance 09/24/2021   Poor historian 09/24/2021    History reviewed. No pertinent surgical history.  OB History   No obstetric history on file.      Home Medications    Prior to Admission medications   Medication Sig Start Date End Date Taking? Authorizing Provider  guaiFENesin (MUCINEX) 600 MG 12 hr tablet Take 2 tablets (1,200 mg total) by mouth 2 (two) times daily as needed. 03/10/22  Yes Sharion Balloon, NP  ondansetron (ZOFRAN) 4 MG tablet Take 1 tablet (4 mg total) by mouth every 6 (six) hours. Patient not taking: Reported on 09/24/2021 03/19/20   Rodney Booze, PA-C    Family History Family History  Problem Relation Age of Onset   Healthy Father    Stroke Mother    Iron deficiency Mother    Healthy Brother    Healthy Sister     Social History Social History   Tobacco Use   Smoking status: Every Day    Types: E-cigarettes    Passive exposure: Past   Smokeless tobacco: Never  Vaping Use   Vaping Use: Some days   Substances: Nicotine  Substance Use Topics   Alcohol use: Yes    Alcohol/week: 3.0 standard drinks of alcohol    Types: 1 Glasses of wine, 1 Cans of beer, 1 Shots of liquor per  week    Comment: last use 09/18/21 2-3x/mo   Drug use: Not Currently    Frequency: 3.0 times per week    Types: Marijuana    Comment: last use 09/21/21     Allergies   Patient has no known allergies.   Review of Systems Review of Systems  Constitutional:  Negative for chills and fever.  HENT:  Positive for congestion, ear pain, rhinorrhea and sneezing. Negative for ear discharge and sore throat.   Respiratory:  Negative for cough and shortness of breath.   Cardiovascular:  Negative for chest pain and palpitations.  Gastrointestinal:  Negative for diarrhea and vomiting.  Skin:  Negative for color change and rash.  All other systems reviewed and are negative.    Physical Exam Triage Vital Signs ED Triage Vitals  Enc Vitals Group     BP      Pulse      Resp      Temp      Temp src      SpO2      Weight      Height      Head Circumference      Peak Flow      Pain Score      Pain Loc  Pain Edu?      Excl. in [place]?    No data found.  Updated Vital Signs BP 112/64   Pulse 82   Temp 98.1 F (36.7 C)   Resp 18   LMP 03/09/2022   SpO2 99%   Visual Acuity Right Eye Distance:   Left Eye Distance:   Bilateral Distance:    Right Eye Near:   Left Eye Near:    Bilateral Near:     Physical Exam Vitals and nursing note reviewed.  Constitutional:      General: She is not in acute distress.    Appearance: She is well-developed. She is not ill-appearing.  HENT:     Right Ear: Tympanic membrane and ear canal normal.     Left Ear: Tympanic membrane and ear canal normal.     Nose: Nose normal.     Mouth/Throat:     Mouth: Mucous membranes are moist.     Pharynx: Oropharynx is clear.  Cardiovascular:     Rate and Rhythm: Normal rate and regular rhythm.     Heart sounds: Normal heart sounds.  Pulmonary:     Effort: Pulmonary effort is normal. No respiratory distress.     Breath sounds: Normal breath sounds.  Musculoskeletal:     Cervical back: Neck supple.   Skin:    General: Skin is warm and dry.  Neurological:     Mental Status: She is alert.  Psychiatric:        Mood and Affect: Mood normal.        Behavior: Behavior normal.      UC Treatments / Results  Labs (all labs ordered are listed, but only abnormal results are displayed) Labs Reviewed - No data to display  EKG   Radiology No results found.  Procedures Procedures (including critical care time)  Medications Ordered in UC Medications - No data to display  Initial Impression / Assessment and Plan / UC Course  I have reviewed the triage vital signs and the nursing notes.  Pertinent labs & imaging results that were available during my care of the patient were reviewed by me and considered in my medical decision making (see chart for details).   Bilateral otalgia and eustachian tube dysfunction, Nasal congestion.  Patient has Flonase nasal spray and will start using it.  Also treating today with guaifenesin.  Education provided on eustachian tube dysfunction.  Instructed patient to follow up with her PCP if her symptoms are not improving.  She agrees to plan of care.    Final Clinical Impressions(s) / UC Diagnoses   Final diagnoses:  Otalgia of both ears  Dysfunction of both eustachian tubes  Nasal congestion     Discharge Instructions      Take the guaifenesin as directed.  Use Flonase nasal spray as directed.  Follow up with your primary care provider if your symptoms are not improving.        ED Prescriptions     Medication Sig Dispense Auth. Provider   guaiFENesin (MUCINEX) 600 MG 12 hr tablet Take 2 tablets (1,200 mg total) by mouth 2 (two) times daily as needed. 12 tablet Sharion Balloon, NP      PDMP not reviewed this encounter.   Sharion Balloon, NP 03/10/22 1500

## 2024-01-07 ENCOUNTER — Ambulatory Visit
Admission: EM | Admit: 2024-01-07 | Discharge: 2024-01-07 | Disposition: A | Discharge status: Home / Self Care | Attending: Student | Admitting: Student

## 2024-01-07 DIAGNOSIS — L739 Follicular disorder, unspecified: Secondary | ICD-10-CM | POA: Diagnosis not present

## 2024-01-07 DIAGNOSIS — K130 Diseases of lips: Secondary | ICD-10-CM

## 2024-01-07 MED ORDER — CEPHALEXIN 500 MG PO CAPS: 500.0000 mg | ORAL_CAPSULE | Freq: Four times a day (QID) | ORAL | 0 refills | Status: AC

## 2024-01-07 NOTE — ED Triage Notes (Signed)
 PT states she has an abscess under her right axillary x 4 days. Denies any drainage at this time.

## 2024-01-07 NOTE — Discharge Instructions (Addendum)
-  For your lips, buy Dr Horald Cortibalm. You can get this on [employer]. Apply twice daily while symptoms persist. You can put vaseline over it to lock the moisture in place.  -For your abscess, start the antibiotic: Keflex  4x daily for 5 days  -Wash your abscess with gentle soap and water 2+ times daily. Do a warm compress or soak in bathtub 1-2 daily. Let air dry or gently pat. Avoid cleansing with hydrogen peroxide or alcohol!! -Seek additional medical attention if the wound is getting worse instead of better, especially after 48 hours- redness increasing in size, pain getting worse, new fevers/chills, etc.

## 2024-01-07 NOTE — ED Provider Notes (Signed)
 Alicia Lawrence    CSN: [id number] Arrival date & time: 01/07/24  1043      History   Chief Complaint Chief Complaint  Patient presents with   Abscess    HPI Alicia Lawrence is a 23 y.o. female presenting w abscess R axilla. PT states she has an abscess under her right axilla x 4 days. Describes a significant amount of spontaneous drainage, which started 1 day ago (01/06/24). Denies prior history axillary abscess. Also notes dry and chapped lips. Has attempted vaseline with some improvement.   HPI  Past Medical History:  Diagnosis Date   Chronic headaches    IBS (irritable bowel syndrome)     Patient Active Problem List   Diagnosis Date Noted   IBS (irritable bowel syndrome) 09/24/2021   Vapes nicotine containing substance 09/24/2021   Poor historian 09/24/2021    History reviewed. No pertinent surgical history.  OB History   No obstetric history on file.      Home Medications    Prior to Admission medications   Medication Sig Start Date End Date Taking? Authorizing Provider  cephALEXin  (KEFLEX ) 500 MG capsule Take 1 capsule (500 mg total) by mouth 4 (four) times daily. 01/07/24  Yes Arlyss Leita BRAVO, PA-C    Family History Family History  Problem Relation Age of Onset   Healthy Father    Stroke Mother    Iron deficiency Mother    Healthy Brother    Healthy Sister     Social History Social History   Tobacco Use   Smoking status: Every Day    Types: E-cigarettes    Passive exposure: Past   Smokeless tobacco: Never  Vaping Use   Vaping status: Some Days   Substances: Nicotine  Substance Use Topics   Alcohol use: Yes    Alcohol/week: 3.0 standard drinks of alcohol    Types: 1 Glasses of wine, 1 Cans of beer, 1 Shots of liquor per week    Comment: last use 09/18/21 2-3x/mo   Drug use: Not Currently    Frequency: 3.0 times per week    Types: Marijuana    Comment: last use 09/21/21     Allergies   Patient has no known allergies.   Review of  Systems Review of Systems  Skin:        Abscess Chapped lips     Physical Exam Triage Vital Signs ED Triage Vitals  Encounter Vitals Group     BP 01/07/24 1145 119/87     Girls Systolic BP Percentile --      Girls Diastolic BP Percentile --      Boys Systolic BP Percentile --      Boys Diastolic BP Percentile --      Pulse Rate 01/07/24 1145 61     Resp 01/07/24 1145 18     Temp 01/07/24 1145 98.7 F (37.1 C)     Temp Source 01/07/24 1145 Oral     SpO2 01/07/24 1145 98 %     Weight --      Height --      Head Circumference --      Peak Flow --      Pain Score 01/07/24 1147 4     Pain Loc --      Pain Education --      Exclude from Growth Chart --    No data found.  Updated Vital Signs BP 119/87 (BP Location: Left Arm)   Pulse 61   Temp  98.7 F (37.1 C) (Oral)   Resp 18   LMP 12/07/2023 (Approximate)   SpO2 98%   Visual Acuity Right Eye Distance:   Left Eye Distance:   Bilateral Distance:    Right Eye Near:   Left Eye Near:    Bilateral Near:     Physical Exam Vitals reviewed.  Constitutional:      General: She is not in acute distress.    Appearance: Normal appearance. She is not ill-appearing.  HENT:     Head: Normocephalic and atraumatic.  Pulmonary:     Effort: Pulmonary effort is normal.  Skin:    Comments: R axilla: 1x1.5cm abscess. There is fluctuance and no induration. There is a significant amount of spontaneous drainage. Exquisitely TTP.  Lips are chapped. No warmth or induration.  Neurological:     General: No focal deficit present.     Mental Status: She is alert and oriented to person, place, and time.  Psychiatric:        Mood and Affect: Mood normal.        Behavior: Behavior normal.        Thought Content: Thought content normal.        Judgment: Judgment normal.      UC Treatments / Results  Labs (all labs ordered are listed, but only abnormal results are displayed) Labs Reviewed - No data to  display  EKG   Radiology No results found.  Procedures Procedures (including critical care time)  Medications Ordered in UC Medications - No data to display  Initial Impression / Assessment and Plan / UC Course  I have reviewed the triage vital signs and the nursing notes.  Pertinent labs & imaging results that were available during my care of the patient were reviewed by me and considered in my medical decision making (see chart for details).     Patient is a pleasant 23 year old female presenting with abscess of the right axilla, and chapped lips. The patient is afebrile and nontachycardic.  Antipyretic has not been administered today.  Abscess is spontaneously draining, and does not require further drainage or I&D today.  She will start warm compresses.  Keflex  sent. For her chapped lips, I recommended over-the-counter Dr. Kai Cortibalm, and Vaseline.  Return precautions as below.   Final Clinical Impressions(s) / UC Diagnoses   Final diagnoses:  Folliculitis of right axilla  Dry lips     Discharge Instructions      -For your lips, buy Dr Horald Cortibalm. You can get this on [employer]. Apply twice daily while symptoms persist. You can put vaseline over it to lock the moisture in place.  -For your abscess, start the antibiotic: Keflex  4x daily for 5 days  -Wash your abscess with gentle soap and water 2+ times daily. Do a warm compress or soak in bathtub 1-2 daily. Let air dry or gently pat. Avoid cleansing with hydrogen peroxide or alcohol!! -Seek additional medical attention if the wound is getting worse instead of better, especially after 48 hours- redness increasing in size, pain getting worse, new fevers/chills, etc.      ED Prescriptions     Medication Sig Dispense Auth. Provider   cephALEXin  (KEFLEX ) 500 MG capsule Take 1 capsule (500 mg total) by mouth 4 (four) times daily. 20 capsule Noa Constante E, PA-C      PDMP not reviewed this encounter.    Arlyss Leita BRAVO, PA-C 01/07/24 1221
